# Patient Record
Sex: Female | Born: 2000 | Race: Black or African American | Hispanic: No | Marital: Single | Smoking: Never smoker
Health system: Southern US, Community
[De-identification: ages and names within clinical notes are randomized; demographics above are authoritative.]

## PROBLEM LIST (undated history)

## (undated) DIAGNOSIS — B009 Herpesviral infection, unspecified: Secondary | ICD-10-CM

## (undated) DIAGNOSIS — D649 Anemia, unspecified: Secondary | ICD-10-CM

---
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
## 2021-02-11 DIAGNOSIS — B009 Herpesviral infection, unspecified: Secondary | ICD-10-CM | POA: Insufficient documentation

## 2021-12-13 ENCOUNTER — Encounter (HOSPITAL_COMMUNITY): Payer: Self-pay | Admitting: Emergency Medicine

## 2021-12-13 ENCOUNTER — Ambulatory Visit (HOSPITAL_COMMUNITY)
Admission: EM | Admit: 2021-12-13 | Discharge: 2021-12-13 | Disposition: A | Discharge status: Home / Self Care | Payer: Medicaid Other | Attending: Emergency Medicine | Admitting: Emergency Medicine

## 2021-12-13 DIAGNOSIS — R21 Rash and other nonspecific skin eruption: Secondary | ICD-10-CM | POA: Diagnosis not present

## 2021-12-13 DIAGNOSIS — N898 Other specified noninflammatory disorders of vagina: Secondary | ICD-10-CM | POA: Insufficient documentation

## 2021-12-13 LAB — POC URINE PREG, ED: Preg Test, Ur: NEGATIVE

## 2021-12-13 MED ORDER — HYDROCORTISONE 1 % EX CREA: TOPICAL_CREAM | CUTANEOUS | 0 refills | Status: DC

## 2021-12-13 MED ORDER — MICONAZOLE NITRATE 2 % EX CREA: 1.0000 | TOPICAL_CREAM | Freq: Two times a day (BID) | CUTANEOUS | 0 refills | Status: DC

## 2021-12-13 MED ORDER — PREDNISONE 20 MG PO TABS: 40.0000 mg | ORAL_TABLET | Freq: Every day | ORAL | 0 refills | Status: DC

## 2021-12-13 MED ORDER — FLUCONAZOLE 150 MG PO TABS: 150.0000 mg | ORAL_TABLET | Freq: Every day | ORAL | 0 refills | Status: AC

## 2021-12-13 NOTE — ED Provider Notes (Signed)
[facility]    CSN: [id number] Arrival date & time: 12/13/21  1424      History   Chief Complaint Chief Complaint  Patient presents with   Vaginal Pain   Skin Problem    HPI Tiffany Cannon is a 21 y.o. female.   Patient presents with genital sore to the labia for 10 days.  Burning caused by urination otherwise site is not painful nor pruritic.  Denies drainage.  History of genital herpes but endorses not consistent with current symptoms.  Has attempted use of coconut oil which has provided some comfort.  Denies urinary symptoms, vaginal discharge, itching or odor.  Sexually active, 1 partner, no condom use, no known exposure.  Patient presents with a rash to the low left shoulder blade beginning 1 week ago.  Site is pruritic and creates a pulsating sensation when lying.  Was told by family that area appears to have a rash.  Is unable to visualize herself.  Has not attempted treatment.  Denies changes in soaps, lotions or detergents.  Does currently have in synthetic braids which she feels may have been rubbing against skin, has had similar spells in the past with no issue.     Past Medical History:  Diagnosis Date   Anemia    Herpes     There are no problems to display for this patient.   History reviewed. No pertinent surgical history.  OB History   No obstetric history on file.      Home Medications    Prior to Admission medications   Medication Sig Start Date End Date Taking? Authorizing Provider  fluconazole (DIFLUCAN) 150 MG tablet Take 1 tablet (150 mg total) by mouth daily. Repeat in 1 week if needed Patient not taking: Reported on 04/24/2020 05/03/19   Eustace Moore, MD  metroNIDAZOLE (FLAGYL) 500 MG tablet Take 1 tablet (500 mg total) by mouth 2 (two) times daily. 08/14/20   Brock Bad, MD  norethindrone-ethinyl estradiol 1/35 (ORTHO-NOVUM 1/35, 28,) tablet Take 1 tablet by mouth daily. 04/24/20   Brock Bad, MD    Family  History Family History  Problem Relation Age of Onset   Hypertension Mother     Social History Social History   Tobacco Use   Smoking status: Never   Smokeless tobacco: Never  Vaping Use   Vaping Use: Never used  Substance Use Topics   Alcohol use: Never   Drug use: Never     Allergies   Patient has no known allergies.   Review of Systems Review of Systems Defer to HPI    Physical Exam Triage Vital Signs ED Triage Vitals  Enc Vitals Group     BP 12/13/21 1550 118/74     Pulse Rate 12/13/21 1550 81     Resp 12/13/21 1550 18     Temp 12/13/21 1550 98.6 F (37 C)     Temp Source 12/13/21 1550 Oral     SpO2 12/13/21 1550 98 %     Weight --      Height --      Head Circumference --      Peak Flow --      Pain Score 12/13/21 1548 0     Pain Loc --      Pain Edu? --      Excl. in GC? --    No data found.  Updated Vital Signs BP 118/74 (BP Location: Right Arm)   Pulse 81   Temp  98.6 F (37 C) (Oral)   Resp 18   LMP 11/19/2021   SpO2 98%   Visual Acuity Right Eye Distance:   Left Eye Distance:   Bilateral Distance:    Right Eye Near:   Left Eye Near:    Bilateral Near:     Physical Exam Constitutional:      Appearance: Normal appearance.  Eyes:     Extraocular Movements: Extraocular movements intact.  Pulmonary:     Effort: Pulmonary effort is normal.  Genitourinary:    Comments: Copious Tiffany Cannon thick vaginal discharge present over the clitoris and bilateral labia, less than 0.5 cm superficial open wound present to the left labia, no drainage noted, chaperone present Skin:    Comments: Scattered less than 0.5 cm flesh tone and erythematous papules present to the left shoulder blade and center back, excoriations scattered throughout, no drainage noted, nontender, no swelling noted  Neurological:     Mental Status: She is alert and oriented to person, place, and time. Mental status is at baseline.  Psychiatric:        Mood and Affect: Mood normal.         Behavior: Behavior normal.      UC Treatments / Results  Labs (all labs ordered are listed, but only abnormal results are displayed) Labs Reviewed - No data to display  EKG   Radiology No results found.  Procedures Procedures (including critical care time)  Medications Ordered in UC Medications - No data to display  Initial Impression / Assessment and Plan / UC Course  I have reviewed the triage vital signs and the nursing notes.  Pertinent labs & imaging results that were available during my care of the patient were reviewed by me and considered in my medical decision making (see chart for details).  Vaginal discharge, rash  Presentation of discharge appears consistent with yeast, most likely as result of open skin to the labia which is caused by irritation, discussed with patient, Diflucan prescribed as well as miconazole external cream, discussed administration, STI labs are pending, advised abstinence until lab results, treatment is complete and symptoms have resolved, urine pregnancy is negative, patient may follow-up for reevaluation as needed  Rash is consistent with a contact dermatitis, no signs of infection, prescribed prednisone course as well as topical hydrocortisone cream to be used as needed after, advised to keep skin clean and dry and covered until symptoms have resolved, may follow-up for reevaluation as needed Final Clinical Impressions(s) / UC Diagnoses   Final diagnoses:  None   Discharge Instructions   None    ED Prescriptions   None    PDMP not reviewed this encounter.   Valinda Hoar, NP 12/13/21 1642

## 2021-12-13 NOTE — ED Triage Notes (Signed)
Pt reports red spot in vaginal area for about a week. Reports painful when using bathroom or wiping. Put coconut oil on area which helps.

## 2021-12-13 NOTE — ED Triage Notes (Signed)
Pt also wants back looked at for about week or so has itching and pulsating pain. Has bumps on back.

## 2021-12-13 NOTE — Discharge Instructions (Signed)
For your back -Skin is consistent with a contact dermatitis meaning your skin has become irritated and inflamed to something that is touched, most likely related to your hair clothing -Begin prednisone every morning with food for 5 days, this will reduce the inflammatory process that occurs with reactions can help to clear rash -After oral medicine is complete you may use hydrocortisone cream every morning and every evening as needed -Follow with urgent care if symptoms persist or worsen  For your genital lesion -Urine pregnancy test is negative -Area appears to be skin irritation most likely related to vaginal discharge which is consistent with yeast and presentation -Take 1 Diflucan tablet when you receive your medicine, then if symptoms are still present in 3 days take second dose -STI labs are pending, you will be notified for any positive test results and additional medication sent to pharmacy at time of notification -Please refrain from sex until all lab results have treated, symptoms are resolved and medication is complete -May follow-up for reevaluation as needed

## 2021-12-16 LAB — CERVICOVAGINAL ANCILLARY ONLY
Bacterial Vaginitis (gardnerella): NEGATIVE
Candida Glabrata: NEGATIVE
Candida Vaginitis: POSITIVE — AB
Chlamydia: NEGATIVE
Comment: NEGATIVE
Comment: NEGATIVE
Comment: NEGATIVE
Comment: NEGATIVE
Comment: NEGATIVE
Comment: NORMAL
Neisseria Gonorrhea: POSITIVE — AB
Trichomonas: NEGATIVE

## 2021-12-17 ENCOUNTER — Encounter (HOSPITAL_COMMUNITY): Payer: Self-pay | Admitting: Emergency Medicine

## 2021-12-17 ENCOUNTER — Telehealth (HOSPITAL_COMMUNITY): Payer: Self-pay | Admitting: Emergency Medicine

## 2021-12-17 ENCOUNTER — Ambulatory Visit (HOSPITAL_COMMUNITY)
Admission: EM | Admit: 2021-12-17 | Discharge: 2021-12-17 | Disposition: A | Discharge status: Home / Self Care | Payer: Medicaid Other | Attending: Internal Medicine | Admitting: Internal Medicine

## 2021-12-17 DIAGNOSIS — A549 Gonococcal infection, unspecified: Secondary | ICD-10-CM

## 2021-12-17 MED ORDER — LIDOCAINE HCL (PF) 1 % IJ SOLN
INTRAMUSCULAR | Status: AC
  Filled 2021-12-17: qty 2

## 2021-12-17 MED ORDER — CEFTRIAXONE SODIUM 500 MG IJ SOLR
500.0000 mg | INTRAMUSCULAR | Status: DC
  Administered 2021-12-17: 500 mg via INTRAMUSCULAR

## 2021-12-17 MED ORDER — CEFTRIAXONE SODIUM 500 MG IJ SOLR
INTRAMUSCULAR | Status: AC
  Filled 2021-12-17: qty 500

## 2021-12-17 MED ORDER — FLUCONAZOLE 150 MG PO TABS: 150.0000 mg | ORAL_TABLET | Freq: Once | ORAL | 0 refills | Status: AC

## 2021-12-17 NOTE — ED Triage Notes (Signed)
No answer in waiting area x 1 

## 2021-12-17 NOTE — Telephone Encounter (Signed)
Per protocol, patient will need treatment with IM Rocephin 500mg  for positive Gonorrhea.   Patient went home on Diflucan Contacted patient by phone.  Verified identity using two identifiers.  Provided positive result.  Reviewed safe sex practices, notifying partners, and refraining from sexual activities for 7 days from time of treatment.  Patient verified understanding, all questions answered.   [facility] notified Verified pharmacy, prescription sent

## 2021-12-17 NOTE — ED Triage Notes (Signed)
Pt reports that needing shot for gonorrhea.

## 2021-12-28 ENCOUNTER — Encounter (HOSPITAL_COMMUNITY): Payer: Self-pay | Admitting: Emergency Medicine

## 2021-12-28 ENCOUNTER — Ambulatory Visit (HOSPITAL_COMMUNITY)
Admission: EM | Admit: 2021-12-28 | Discharge: 2021-12-28 | Disposition: A | Discharge status: Home / Self Care | Payer: Medicaid Other | Attending: Physician Assistant | Admitting: Physician Assistant

## 2021-12-28 DIAGNOSIS — J020 Streptococcal pharyngitis: Secondary | ICD-10-CM | POA: Diagnosis not present

## 2021-12-28 LAB — POCT RAPID STREP A, ED / UC: Streptococcus, Group A Screen (Direct): POSITIVE — AB

## 2021-12-28 MED ORDER — IBUPROFEN 800 MG PO TABS
ORAL_TABLET | ORAL | Status: AC
  Filled 2021-12-28: qty 1

## 2021-12-28 MED ORDER — PENICILLIN G BENZATHINE 1200000 UNIT/2ML IM SUSY
PREFILLED_SYRINGE | INTRAMUSCULAR | Status: AC
  Filled 2021-12-28: qty 2

## 2021-12-28 MED ORDER — PENICILLIN G BENZATHINE 1200000 UNIT/2ML IM SUSY
1.2000 10*6.[IU] | PREFILLED_SYRINGE | Freq: Once | INTRAMUSCULAR | Status: AC
  Administered 2021-12-28: 1.2 10*6.[IU] via INTRAMUSCULAR

## 2021-12-28 MED ORDER — IBUPROFEN 800 MG PO TABS
800.0000 mg | ORAL_TABLET | Freq: Once | ORAL | Status: AC
  Administered 2021-12-28: 800 mg via ORAL

## 2021-12-28 NOTE — ED Provider Notes (Signed)
[facility]   MRN: [id number] DOB: Sep 08, 2000  Subjective:   Tiffany Cannon is a 21 y.o. female presenting for sore throat x 3 days.  It hurts to eat, speak, swallow foods.  She has been able to swallow liquids but states that this is painful as well.  She feels like she is coughing up some mucus.  She has had some bodyaches and fatigue.  No headache.  No fever.  No chest pain or shortness of breath.  Denies any recent sick contacts.  She did not take any medicine today so far.  Patient reports that she was seen at her primary care about 3 days ago and was tested for COVID and strep, both of which were negative at that time.   Current Facility-Administered Medications:    penicillin g benzathine (BICILLIN LA) 1200000 UNIT/2ML injection 1.2 Million Units, 1.2 Million Units, Intramuscular, Once, Jaquetta Currier M, PA-C  Current Outpatient Medications:    hydrocortisone cream 1 %, Apply to affected area 2 times daily, Disp: 15 g, Rfl: 0   metroNIDAZOLE (FLAGYL) 500 MG tablet, Take 1 tablet (500 mg total) by mouth 2 (two) times daily., Disp: 14 tablet, Rfl: 0   miconazole (MICOTIN) 2 % cream, Apply 1 Application topically 2 (two) times daily., Disp: 28.35 g, Rfl: 0   norethindrone-ethinyl estradiol 1/35 (ORTHO-NOVUM 1/35, 28,) tablet, Take 1 tablet by mouth daily., Disp: 28 tablet, Rfl: 11   predniSONE (DELTASONE) 20 MG tablet, Take 2 tablets (40 mg total) by mouth daily., Disp: 10 tablet, Rfl: 0   No Known Allergies  Past Medical History:  Diagnosis Date   Anemia    Herpes      History reviewed. No pertinent surgical history.  Family History  Problem Relation Age of Onset   Hypertension Mother     Social History   Tobacco Use   Smoking status: Never   Smokeless tobacco: Never  Vaping Use   Vaping Use: Never used  Substance Use Topics   Alcohol use: Never   Drug use: Never    ROS REFER TO HPI FOR PERTINENT POSITIVES AND NEGATIVES   Objective:    Vitals: Pulse 83   Temp 99 F (37.2 C) (Oral)   Resp 17   LMP 11/14/2021   SpO2 97%   Physical Exam Vitals and nursing note reviewed.  Constitutional:      General: She is not in acute distress.    Appearance: Normal appearance. She is not ill-appearing.     Comments: Patient has an emesis bag and is actively spitting up mucus intermittently.  HENT:     Head: Normocephalic.     Right Ear: Tympanic membrane, ear canal and external ear normal.     Left Ear: Tympanic membrane, ear canal and external ear normal.     Nose: No congestion.     Mouth/Throat:     Mouth: Mucous membranes are moist.     Pharynx: Uvula midline. Oropharyngeal exudate and posterior oropharyngeal erythema present. No uvula swelling.     Tonsils: Tonsillar exudate present. No tonsillar abscesses. 3+ on the right. 3+ on the left.  Eyes:     Extraocular Movements: Extraocular movements intact.     Conjunctiva/sclera: Conjunctivae normal.     Pupils: Pupils are equal, round, and reactive to light.  Cardiovascular:     Rate and Rhythm: Normal rate and regular rhythm.     Pulses: Normal pulses.     Heart sounds: Normal heart sounds. No  murmur heard. Pulmonary:     Effort: Pulmonary effort is normal. No respiratory distress.     Breath sounds: Normal breath sounds. No wheezing.  Abdominal:     General: Abdomen is flat.     Palpations: Abdomen is soft. There is no mass.     Tenderness: There is no guarding.  Musculoskeletal:     Cervical back: Normal range of motion.  Skin:    General: Skin is warm.  Neurological:     Mental Status: She is alert and oriented to person, place, and time.  Psychiatric:        Mood and Affect: Mood normal.        Behavior: Behavior normal.     Results for orders placed or performed during the hospital encounter of 12/28/21 (from the past 24 hour(s))  POCT Rapid Strep A     Status: Abnormal   Collection Time: 12/28/21  1:34 PM  Result Value Ref Range   Streptococcus,  Group A Screen (Direct) POSITIVE (A) NEGATIVE    Assessment and Plan :   PDMP not reviewed this encounter.  1. Streptococcal pharyngitis   Rapid strep a test positive.  Confirmed that patient has no drug allergies.  Because of difficulty and pain with swallowing, offered Bicillin injection and patient agreed to this.  She will alternate Tylenol and ibuprofen.  She will push fluids.  Strict ER precautions discussed.  Work note provided.  Patient agreeable and understanding of plan.     Laketha Leopard, Crist Infante, PA-C 12/28/21 1400

## 2021-12-28 NOTE — ED Triage Notes (Signed)
Pt wrote note stating "I can't speak. I came here today due to me not being able to eat, and barely drink. Mucous is clogged and it hurts when I cough it up. This been going on for 3 days".

## 2021-12-28 NOTE — Discharge Instructions (Signed)
Your test for strep throat was positive.  You were given a shot of penicillin, which should help to resolve your infection in the next few days.  Please continue to push fluids and alternate Tylenol and ibuprofen.  Present to the emergency department if acutely worse symptoms.

## 2022-04-28 ENCOUNTER — Ambulatory Visit
Admission: RE | Admit: 2022-04-28 | Discharge: 2022-04-28 | Disposition: A | Discharge status: Home / Self Care | Payer: Self-pay | Source: Ambulatory Visit | Attending: Internal Medicine | Admitting: Internal Medicine

## 2022-04-28 VITALS — BP 151/84 | HR 84 | Temp 98.4°F | Resp 16

## 2022-04-28 DIAGNOSIS — N898 Other specified noninflammatory disorders of vagina: Secondary | ICD-10-CM | POA: Insufficient documentation

## 2022-04-28 DIAGNOSIS — Z202 Contact with and (suspected) exposure to infections with a predominantly sexual mode of transmission: Secondary | ICD-10-CM | POA: Insufficient documentation

## 2022-04-28 MED ORDER — METRONIDAZOLE 500 MG PO TABS: 500.0000 mg | ORAL_TABLET | Freq: Two times a day (BID) | ORAL | 0 refills | Status: DC

## 2022-04-28 NOTE — Discharge Instructions (Addendum)
You were tested today for STDs and the results are still pending; you have been given treatment for possible infections presumptively anyway due to your symptoms. You will receive a phone call in approximately 3 days if the results are positive. You should follow up with your primary care provider for further STI testing.  Avoid sexual intercourse for 7 days. Advise your sexual partner(s) to be evaluated, tested and treated. This includes all sexual partners within the past 60 days or your last sexual partner if last contact was greater than 60 days.  To minimize the risk of reinfection, you should abstain from sexual intercourse until your sexual partners have been tested and treated.   Consistent condom use is important in preventing the spread of sexually transmitted infections.  We will treat for any other positive results when your labs come back. If you do not hear from us, this means that your STI testing was negative or there is no change to your treatment plan. You will also receive these results via MyChart.   Return if you experience fevers 100.4 or greater, worsening or uncontrolled pain, rashes, sores, vomiting, or for any other concerning symptoms.   

## 2022-04-28 NOTE — ED Provider Notes (Signed)
[name]    CSN: [id number] Arrival date & time: 04/28/22  1723      History   Chief Complaint Chief Complaint  Patient presents with   Exposure to STD    I would like to get tested for everything. And I also have an issue w my body, that's causing me to jerk whenever I am cold, " anxiety ". Help please. - Entered by patient    HPI Tiffany Cannon is a 22 y.o. female.   Patient presents to urgent care for evaluation of vaginal odor with thick white vaginal discharge starting a couple of days ago. Reports recent new female unprotected sexual partner and concern for STD, would like to be screened. Denies known exposure to STD, vaginal itching, rash, nausea, vomiting, diarrhea, abdominal pain, and urinary symptoms. LMP 3/13-3/18. No OTC medicines attempted to help with symptoms.   Exposure to STD    Past Medical History:  Diagnosis Date   Anemia    Herpes     There are no problems to display for this patient.   History reviewed. No pertinent surgical history.  OB History   No obstetric history on file.      Home Medications    Prior to Admission medications   Medication Sig Start Date End Date Taking? Authorizing Provider  metroNIDAZOLE (FLAGYL) 500 MG tablet Take 1 tablet (500 mg total) by mouth 2 (two) times daily. 04/28/22  Yes Carlisle BeersStanhope, [place] Gandolfo M, FNP  hydrocortisone cream 1 % Apply to affected area 2 times daily 12/13/21   Valinda HoarWhite, Adrienne R, NP  miconazole (MICOTIN) 2 % cream Apply 1 Application topically 2 (two) times daily. 12/13/21   Valinda HoarWhite, Adrienne R, NP  norethindrone-ethinyl estradiol 1/35 (ORTHO-NOVUM 1/35, 28,) tablet Take 1 tablet by mouth daily. 04/24/20   Brock BadHarper, Charles A, MD  predniSONE (DELTASONE) 20 MG tablet Take 2 tablets (40 mg total) by mouth daily. 12/13/21   Valinda HoarWhite, Adrienne R, NP    Family History Family History  Problem Relation Age of Onset   Hypertension Mother     Social History Social History   Tobacco Use   Smoking  status: Never   Smokeless tobacco: Never  Vaping Use   Vaping Use: Never used  Substance Use Topics   Alcohol use: Never   Drug use: Never     Allergies   Patient has no known allergies.   Review of Systems Review of Systems Per HPI  Physical Exam Triage Vital Signs ED Triage Vitals [04/28/22 1935]  Enc Vitals Group     BP (!) 151/84     Pulse Rate 84     Resp 16     Temp 98.4 F (36.9 C)     Temp Source Oral     SpO2 98 %     Weight      Height      Head Circumference      Peak Flow      Pain Score 0     Pain Loc      Pain Edu?      Excl. in GC?    No data found.  Updated Vital Signs BP (!) 151/84 (BP Location: Left Arm)   Pulse 84   Temp 98.4 F (36.9 C) (Oral)   Resp 16   SpO2 98%   Visual Acuity Right Eye Distance:   Left Eye Distance:   Bilateral Distance:    Right Eye Near:   Left Eye Near:    Bilateral  Near:     Physical Exam Vitals and nursing note reviewed.  Constitutional:      Appearance: She is not ill-appearing or toxic-appearing.  HENT:     Head: Normocephalic and atraumatic.     Right Ear: Hearing and external ear normal.     Left Ear: Hearing and external ear normal.     Nose: Nose normal.     Mouth/Throat:     Lips: Pink.  Eyes:     General: Lids are normal. Vision grossly intact. Gaze aligned appropriately.     Extraocular Movements: Extraocular movements intact.     Conjunctiva/sclera: Conjunctivae normal.  Pulmonary:     Effort: Pulmonary effort is normal.  Genitourinary:    Comments: Deferred.  Musculoskeletal:     Cervical back: Neck supple.  Skin:    General: Skin is warm and dry.     Capillary Refill: Capillary refill takes less than 2 seconds.     Findings: No rash.  Neurological:     General: No focal deficit present.     Mental Status: She is alert and oriented to person, place, and time. Mental status is at baseline.     Cranial Nerves: No dysarthria or facial asymmetry.  Psychiatric:        Mood and  Affect: Mood normal.        Speech: Speech normal.        Behavior: Behavior normal.        Thought Content: Thought content normal.        Judgment: Judgment normal.      UC Treatments / Results  Labs (all labs ordered are listed, but only abnormal results are displayed) Labs Reviewed  HIV ANTIBODY (ROUTINE TESTING W REFLEX)  RPR  CERVICOVAGINAL ANCILLARY ONLY    EKG   Radiology No results found.  Procedures Procedures (including critical care time)  Medications Ordered in UC Medications - No data to display  Initial Impression / Assessment and Plan / UC Course  I have reviewed the triage vital signs and the nursing notes.  Pertinent labs & imaging results that were available during my care of the patient were reviewed by me and considered in my medical decision making (see chart for details).   1. Possible exposure to STD STI labs pending, patient would like HIV and/or syphilis testing today. Will go ahead and start treatment for bacterial vaginosis based on symptoms and history of same. Flagyl sent to pharmacy to be taken as prescribed. Discussed potential interaction with alcohol use during use of medication. Will treat for all other positive results once STI labs result. Patient to abstain from sexual intercourse for 7 days while undergoing treatment. Education provided regarding safe sexual practices and patient encouraged to use protection to prevent spread of STIs.   Discussed physical exam and available lab work findings in clinic with patient.  Counseled patient regarding appropriate use of medications and potential side effects for all medications recommended or prescribed today. Discussed red flag signs and symptoms of worsening condition,when to call the PCP office, return to urgent care, and when to seek higher level of care in the emergency department. Patient verbalizes understanding and agreement with plan. All questions answered. Patient discharged in stable  condition.    Final Clinical Impressions(s) / UC Diagnoses   Final diagnoses:  Possible exposure to STD     Discharge Instructions      You were tested today for STDs and the results are still pending; you have been  given treatment for possible infections presumptively anyway due to your symptoms. You will receive a phone call in approximately 3 days if the results are positive. You should follow up with your primary care provider for further STI testing.   Avoid sexual intercourse for 7 days. Advise your sexual partner(s) to be evaluated, tested and treated. This includes all sexual partners within the past 60 days or your last sexual partner if last contact was greater than 60 days.  To minimize the risk of reinfection, you should abstain from sexual intercourse until your sexual partners have been tested and treated.   Consistent condom use is important in preventing the spread of sexually transmitted infections.  We will treat for any other positive results when your labs come back. If you do not hear from [place], this means that your STI testing was negative or there is no change to your treatment plan. You will also receive these results via MyChart.   Return if you experience fevers 100.4 or greater, worsening or uncontrolled pain, rashes, sores, vomiting, or for any other concerning symptoms.      ED Prescriptions     Medication Sig Dispense Auth. Provider   metroNIDAZOLE (FLAGYL) 500 MG tablet Take 1 tablet (500 mg total) by mouth 2 (two) times daily. 14 tablet Carlisle Beers, FNP      PDMP not reviewed this encounter.   Carlisle Beers, [place] 05/01/22 2057

## 2022-04-28 NOTE — ED Triage Notes (Signed)
Pt c/o intermittent vaginal odor requesting sti screening. Onset ~ over a week ago   Also states when she is around others her body shakes and concerned for anxiety onset ~ Jan.

## 2022-04-30 ENCOUNTER — Telehealth: Payer: Self-pay | Admitting: Emergency Medicine

## 2022-04-30 LAB — HIV ANTIBODY (ROUTINE TESTING W REFLEX): HIV Screen 4th Generation wRfx: NONREACTIVE

## 2022-04-30 LAB — RPR: RPR Ser Ql: NONREACTIVE

## 2022-04-30 LAB — CERVICOVAGINAL ANCILLARY ONLY
Bacterial Vaginitis (gardnerella): POSITIVE — AB
Candida Glabrata: NEGATIVE
Candida Vaginitis: NEGATIVE
Chlamydia: POSITIVE — AB
Comment: NEGATIVE
Comment: NEGATIVE
Comment: NEGATIVE
Comment: NEGATIVE
Comment: NEGATIVE
Comment: NORMAL
Neisseria Gonorrhea: POSITIVE — AB
Trichomonas: NEGATIVE

## 2022-04-30 MED ORDER — DOXYCYCLINE HYCLATE 100 MG PO CAPS: 100.0000 mg | ORAL_CAPSULE | Freq: Two times a day (BID) | ORAL | 0 refills | Status: DC

## 2022-05-01 ENCOUNTER — Ambulatory Visit (HOSPITAL_COMMUNITY): Payer: Self-pay

## 2022-05-01 ENCOUNTER — Ambulatory Visit
Admission: RE | Admit: 2022-05-01 | Discharge: 2022-05-01 | Disposition: A | Discharge status: Home / Self Care | Payer: Self-pay | Source: Ambulatory Visit | Attending: Nurse Practitioner | Admitting: Nurse Practitioner

## 2022-05-01 DIAGNOSIS — B9689 Other specified bacterial agents as the cause of diseases classified elsewhere: Secondary | ICD-10-CM

## 2022-05-01 DIAGNOSIS — A749 Chlamydial infection, unspecified: Secondary | ICD-10-CM

## 2022-05-01 DIAGNOSIS — N760 Acute vaginitis: Secondary | ICD-10-CM

## 2022-05-01 DIAGNOSIS — A549 Gonococcal infection, unspecified: Secondary | ICD-10-CM

## 2022-05-01 MED ORDER — CEFTRIAXONE SODIUM 500 MG IJ SOLR
500.0000 mg | Freq: Once | INTRAMUSCULAR | Status: AC
  Administered 2022-05-01: 500 mg via INTRAMUSCULAR

## 2022-05-01 MED ORDER — METRONIDAZOLE 500 MG PO TABS: 500.0000 mg | ORAL_TABLET | Freq: Two times a day (BID) | ORAL | 0 refills | Status: DC

## 2022-05-01 MED ORDER — DOXYCYCLINE HYCLATE 100 MG PO CAPS: 100.0000 mg | ORAL_CAPSULE | Freq: Two times a day (BID) | ORAL | 0 refills | Status: AC

## 2022-05-01 NOTE — Discharge Instructions (Addendum)
You have been treated for gonorrhea, chlamydia and bacterial vaginosis.  Make sure you finish all the medications as prescribed. You should not have sex until the medications are completed and your partner(s) have also been treated.  It is important that you practice safe sex practices by wearing a condom every time you have sex. Remember that people who have STIs may not experience any symptoms. However, even without symptoms, these infections can be spread from person to person and require treatment. STIs can be treated, and many STIs can be cured. However, some STIs cannot be cured and will affect you for the rest of your life. It's important to be checked regularly for STIs. You should also consider taking pre-exposure prophylaxis (PrEP) to prevent HIV infection.

## 2022-05-01 NOTE — ED Provider Notes (Signed)
Patient presents for STD treatment. She was seen here on 04/28/22. She has tested positive for gonorrhea, chlamydia and bacterial vaginosis.  Flagyl and Doxycycline sent into patient's pharmacy prior to her arrival; however, patient now is requesting that these medications be sent to a different pharmacy.  Prescription sent to the patient's pharmacy of choice.  Rocephin 500 mg IM given in clinic.  Safe sex education provided.   Lurline Idol, [place] 05/01/22 1640

## 2022-05-01 NOTE — ED Triage Notes (Signed)
Pt presents for std treatment. Denies concerns.

## 2022-05-02 ENCOUNTER — Ambulatory Visit: Payer: Self-pay | Admitting: Internal Medicine

## 2022-06-09 ENCOUNTER — Ambulatory Visit (HOSPITAL_COMMUNITY): Payer: Self-pay

## 2022-06-21 ENCOUNTER — Encounter (HOSPITAL_COMMUNITY): Payer: Self-pay

## 2022-06-21 ENCOUNTER — Ambulatory Visit (HOSPITAL_COMMUNITY)
Admission: RE | Admit: 2022-06-21 | Discharge: 2022-06-21 | Disposition: A | Discharge status: Home / Self Care | Payer: Medicaid Other | Source: Ambulatory Visit | Attending: Physician Assistant | Admitting: Physician Assistant

## 2022-06-21 VITALS — BP 115/79 | HR 76 | Temp 98.5°F | Resp 18

## 2022-06-21 DIAGNOSIS — Z113 Encounter for screening for infections with a predominantly sexual mode of transmission: Secondary | ICD-10-CM | POA: Insufficient documentation

## 2022-06-21 DIAGNOSIS — N898 Other specified noninflammatory disorders of vagina: Secondary | ICD-10-CM | POA: Insufficient documentation

## 2022-06-21 DIAGNOSIS — Z3202 Encounter for pregnancy test, result negative: Secondary | ICD-10-CM

## 2022-06-21 DIAGNOSIS — N760 Acute vaginitis: Secondary | ICD-10-CM

## 2022-06-21 LAB — HIV ANTIBODY (ROUTINE TESTING W REFLEX): HIV Screen 4th Generation wRfx: NONREACTIVE

## 2022-06-21 LAB — HEPATITIS C ANTIBODY: HCV Ab: NONREACTIVE

## 2022-06-21 LAB — POCT URINE PREGNANCY: Preg Test, Ur: NEGATIVE

## 2022-06-21 MED ORDER — FLUCONAZOLE 150 MG PO TABS: 150.0000 mg | ORAL_TABLET | ORAL | 0 refills | Status: DC

## 2022-06-21 MED ORDER — FLUCONAZOLE 150 MG PO TABS: 150.0000 mg | ORAL_TABLET | ORAL | 0 refills | Status: AC

## 2022-06-21 NOTE — Discharge Instructions (Signed)
Your urine pregnancy test was negative.  We are treating you for a yeast infection.  Please take 1 dose of Diflucan today and a second dose in 3 days.  Use hypoallergenic soaps and detergents and wear loosefitting cotton underwear.  We will contact you if any of your testing obtained today is positive.  Monitor your MyChart for these results.  Use a condom with each sexual encounter.  Make sure all partners are tested and treated as well as you can get recurrent STIs.  If you develop any worsening or changing symptoms including pelvic pain, abdominal pain, fever, nausea, vomiting you should be seen immediately.

## 2022-06-21 NOTE — ED Triage Notes (Signed)
Pt is here for std testing. Pt reports vaginal itching and burning. Pt is taking Flagyl.

## 2022-06-21 NOTE — ED Provider Notes (Signed)
[facility]    CSN: [id number] Arrival date & time: 06/21/22  1111      History   Chief Complaint Chief Complaint  Patient presents with   SEXUALLY TRANSMITTED DISEASE    Entered by patient    HPI Tiffany Cannon is a 22 y.o. female.   Patient presents today with a several week history of thick white discharge and associated vaginal irritation/pruritus.  She was seen by our clinic on 04/28/2022 at which point she tested positive for gonorrhea, chlamydia, bacterial vaginosis.  She did receive treatment and has completed course of antibiotics.  She denies any changes to personal hygiene products.  She has been using tea tree oil and personal wipes which provides temporary relief of symptoms.  She does not believe that she is pregnant but is open to testing she cannot remember her last menstrual cycle.  She denies history of diabetes or immunosuppression.  Does not take SGLT2 inhibitor.  She has not tried any over-the-counter medication for symptom management.    Past Medical History:  Diagnosis Date   Anemia    Herpes     There are no problems to display for this patient.   History reviewed. No pertinent surgical history.  OB History   No obstetric history on file.      Home Medications    Prior to Admission medications   Medication Sig Start Date End Date Taking? Authorizing Provider  fluconazole (DIFLUCAN) 150 MG tablet Take 1 tablet (150 mg total) by mouth every 3 (three) days for 2 doses. 06/21/22 06/25/22 Yes Clary Meeker, Noberto Retort, PA-C  hydrocortisone cream 1 % Apply to affected area 2 times daily 12/13/21   Valinda Hoar, NP  norethindrone-ethinyl estradiol 1/35 (ORTHO-NOVUM 1/35, 28,) tablet Take 1 tablet by mouth daily. 04/24/20   Brock Bad, MD    Family History Family History  Problem Relation Age of Onset   Hypertension Mother     Social History Social History   Tobacco Use   Smoking status: Never   Smokeless tobacco: Never  Vaping Use    Vaping Use: Never used  Substance Use Topics   Alcohol use: Never   Drug use: Never     Allergies   Patient has no known allergies.   Review of Systems Review of Systems  Constitutional:  Positive for activity change. Negative for appetite change, fatigue and fever.  Gastrointestinal:  Negative for abdominal pain, diarrhea, nausea and vomiting.  Genitourinary:  Positive for vaginal discharge and vaginal pain (irritation). Negative for dysuria, frequency, pelvic pain, urgency and vaginal bleeding.     Physical Exam Triage Vital Signs ED Triage Vitals [06/21/22 1123]  Enc Vitals Group     BP 115/79     Pulse Rate 76     Resp 18     Temp 98.5 F (36.9 C)     Temp Source Oral     SpO2 98 %     Weight      Height      Head Circumference      Peak Flow      Pain Score      Pain Loc      Pain Edu?      Excl. in GC?    No data found.  Updated Vital Signs BP 115/79 (BP Location: Left Arm)   Pulse 76   Temp 98.5 F (36.9 C) (Oral)   Resp 18   SpO2 98%   Visual Acuity Right Eye Distance:  Left Eye Distance:   Bilateral Distance:    Right Eye Near:   Left Eye Near:    Bilateral Near:     Physical Exam Vitals reviewed.  Constitutional:      General: She is awake. She is not in acute distress.    Appearance: Normal appearance. She is well-developed. She is not ill-appearing.     Comments: Very pleasant female appears stated age in no acute distress sitting comfortably in exam room  HENT:     Head: Normocephalic and atraumatic.  Cardiovascular:     Rate and Rhythm: Normal rate and regular rhythm.     Heart sounds: Normal heart sounds, S1 normal and S2 normal. No murmur heard. Pulmonary:     Effort: Pulmonary effort is normal.     Breath sounds: Normal breath sounds. No wheezing, rhonchi or rales.     Comments: Clear to auscultation bilaterally Abdominal:     General: Bowel sounds are normal.     Palpations: Abdomen is soft.     Tenderness: There is no  abdominal tenderness. There is no right CVA tenderness, left CVA tenderness, guarding or rebound.  Psychiatric:        Behavior: Behavior is cooperative.      UC Treatments / Results  Labs (all labs ordered are listed, but only abnormal results are displayed) Labs Reviewed  RPR  HIV ANTIBODY (ROUTINE TESTING W REFLEX)  HEPATITIS C ANTIBODY  POCT URINE PREGNANCY  CERVICOVAGINAL ANCILLARY ONLY    EKG   Radiology No results found.  Procedures Procedures (including critical care time)  Medications Ordered in UC Medications - No data to display  Initial Impression / Assessment and Plan / UC Course  I have reviewed the triage vital signs and the nursing notes.  Pertinent labs & imaging results that were available during my care of the patient were reviewed by me and considered in my medical decision making (see chart for details).     Patient is well-appearing, afebrile, nontoxic, nontachycardic.  Vital signs physical exam are reassuring.  Urine pregnancy test was obtained and was negative.  Suspect yeast vaginitis as etiology of symptoms given recent antibiotic use and description of symptoms.  She was empirically treated with Diflucan 1 dose today and a second dose in 3 days if symptoms persist.  STI swab was collected and is pending.  Additional STI testing including HIV, hepatitis, syphilis was obtained and is pending.  We discussed the importance of safe sex practices.  Also discussed that all partners need to be tested and treated as well as it is possible to get STIs again.  If she has any worsening or changing symptoms including persistent discharge, abdominal pain, pelvic pain, fever, nausea, vomiting she needs to be seen immediately.  Strict return precautions given.  Final Clinical Impressions(s) / UC Diagnoses   Final diagnoses:  Acute vaginitis  Vaginal discharge  Screening examination for STI  Encounter for pregnancy test with result negative     Discharge  Instructions      Your urine pregnancy test was negative.  We are treating you for a yeast infection.  Please take 1 dose of Diflucan today and a second dose in 3 days.  Use hypoallergenic soaps and detergents and wear loosefitting cotton underwear.  We will contact you if any of your testing obtained today is positive.  Monitor your MyChart for these results.  Use a condom with each sexual encounter.  Make sure all partners are tested and treated as  well as you can get recurrent STIs.  If you develop any worsening or changing symptoms including pelvic pain, abdominal pain, fever, nausea, vomiting you should be seen immediately.     ED Prescriptions     Medication Sig Dispense Auth. Provider   fluconazole (DIFLUCAN) 150 MG tablet Take 1 tablet (150 mg total) by mouth every 3 (three) days for 2 doses. 2 tablet Kallum Jorgensen, Noberto Retort, PA-C      PDMP not reviewed this encounter.   Jeani Hawking, PA-C 06/21/22 1204

## 2022-06-22 LAB — RPR: RPR Ser Ql: NONREACTIVE

## 2022-06-23 LAB — CERVICOVAGINAL ANCILLARY ONLY
Bacterial Vaginitis (gardnerella): NEGATIVE
Candida Glabrata: NEGATIVE
Candida Vaginitis: POSITIVE — AB
Chlamydia: NEGATIVE
Comment: NEGATIVE
Comment: NEGATIVE
Comment: NEGATIVE
Comment: NEGATIVE
Comment: NEGATIVE
Comment: NORMAL
Neisseria Gonorrhea: NEGATIVE
Trichomonas: NEGATIVE

## 2022-08-08 ENCOUNTER — Ambulatory Visit (HOSPITAL_COMMUNITY): Payer: Medicaid Other

## 2022-08-28 ENCOUNTER — Encounter (HOSPITAL_COMMUNITY): Payer: Self-pay

## 2022-08-28 ENCOUNTER — Ambulatory Visit (HOSPITAL_COMMUNITY)
Admission: EM | Admit: 2022-08-28 | Discharge: 2022-08-28 | Disposition: A | Discharge status: Home / Self Care | Payer: Medicaid Other | Attending: Family Medicine | Admitting: Family Medicine

## 2022-08-28 DIAGNOSIS — R102 Pelvic and perineal pain: Secondary | ICD-10-CM | POA: Insufficient documentation

## 2022-08-28 LAB — POCT URINALYSIS DIP (MANUAL ENTRY)
Bilirubin, UA: NEGATIVE
Blood, UA: NEGATIVE
Glucose, UA: NEGATIVE mg/dL
Ketones, POC UA: NEGATIVE mg/dL
Leukocytes, UA: NEGATIVE
Nitrite, UA: NEGATIVE
Protein Ur, POC: NEGATIVE mg/dL
Spec Grav, UA: 1.025 (ref 1.010–1.025)
Urobilinogen, UA: 0.2 E.U./dL
pH, UA: 6.5 (ref 5.0–8.0)

## 2022-08-28 LAB — HIV ANTIBODY (ROUTINE TESTING W REFLEX): HIV Screen 4th Generation wRfx: NONREACTIVE

## 2022-08-28 LAB — POCT URINE PREGNANCY: Preg Test, Ur: NEGATIVE

## 2022-08-28 NOTE — Discharge Instructions (Signed)
The pregnancy test was negative  The urinalysis was clear and did not show any sign of urinary infection.  Staff will notify you if there is anything positive on the swab or the blood work (which was drawn to check HIV and syphilis tests).

## 2022-08-28 NOTE — ED Triage Notes (Signed)
Abdominal pain onset a few week. Will have chest tightness in to low abdominal pain/pressure. No known std exposure and wanting to be tested.   No urinary symptoms, discharge, or odor.

## 2022-08-28 NOTE — ED Provider Notes (Signed)
[facility]    CSN: [id number] Arrival date & time: 08/28/22  1017      History   Chief Complaint Chief Complaint  Patient presents with   Abdominal Pain   SEXUALLY TRANSMITTED DISEASE    HPI Tiffany Cannon is a 22 y.o. female.    Abdominal Pain Here for some pelvic pain that has been going on for a few weeks.  Constant and is more of a pressure.  No dysuria or hematuria.  No fever or chills or nausea or vomiting.  No constipation and bowel movements have been her usual normal.  LMP was July 21.  She is not allergic to any medication    Past Medical History:  Diagnosis Date   Anemia    Herpes     There are no problems to display for this patient.   History reviewed. No pertinent surgical history.  OB History   No obstetric history on file.      Home Medications    Prior to Admission medications   Not on File    Family History Family History  Problem Relation Age of Onset   Hypertension Mother     Social History Social History   Tobacco Use   Smoking status: Never   Smokeless tobacco: Never  Vaping Use   Vaping status: Never Used  Substance Use Topics   Alcohol use: Never   Drug use: Never     Allergies   Patient has no known allergies.   Review of Systems Review of Systems  Gastrointestinal:  Positive for abdominal pain.     Physical Exam Triage Vital Signs ED Triage Vitals  Encounter Vitals Group     BP 08/28/22 1034 110/79     Systolic BP Percentile --      Diastolic BP Percentile --      Pulse Rate 08/28/22 1034 69     Resp 08/28/22 1034 16     Temp 08/28/22 1034 (!) 97.3 F (36.3 C)     Temp Source 08/28/22 1034 Oral     SpO2 08/28/22 1034 97 %     Weight 08/28/22 1034 240 lb (108.9 kg)     Height 08/28/22 1034 5\' 9"  (1.753 m)     Head Circumference --      Peak Flow --      Pain Score 08/28/22 1033 8     Pain Loc --      Pain Education --      Exclude from Growth Chart --    No data  found.  Updated Vital Signs BP 110/79 (BP Location: Right Arm)   Pulse 69   Temp (!) 97.3 F (36.3 C) (Oral)   Resp 16   Ht 5\' 9"  (1.753 m)   Wt 108.9 kg   LMP 08/10/2022 (Approximate)   SpO2 97%   BMI 35.44 kg/m   Visual Acuity Right Eye Distance:   Left Eye Distance:   Bilateral Distance:    Right Eye Near:   Left Eye Near:    Bilateral Near:     Physical Exam Vitals reviewed.  Constitutional:      General: She is not in acute distress.    Appearance: She is not ill-appearing, toxic-appearing or diaphoretic.  HENT:     Mouth/Throat:     Mouth: Mucous membranes are moist.  Cardiovascular:     Rate and Rhythm: Normal rate and regular rhythm.     Heart sounds: No murmur heard. Pulmonary:  Effort: Pulmonary effort is normal.     Breath sounds: Normal breath sounds.  Abdominal:     General: Bowel sounds are normal. There is no distension.     Palpations: Abdomen is soft. There is no mass.     Tenderness: There is no abdominal tenderness. There is no guarding.  Skin:    Coloration: Skin is not jaundiced or pale.  Neurological:     General: No focal deficit present.     Mental Status: She is alert and oriented to person, place, and time.  Psychiatric:        Behavior: Behavior normal.      UC Treatments / Results  Labs (all labs ordered are listed, but only abnormal results are displayed) Labs Reviewed  POCT URINALYSIS DIP (MANUAL ENTRY) - Abnormal; Notable for the following components:      Result Value   Clarity, UA hazy (*)    All other components within normal limits  RPR  HIV ANTIBODY (ROUTINE TESTING W REFLEX)  POCT URINE PREGNANCY  CERVICOVAGINAL ANCILLARY ONLY    EKG   Radiology No results found.  Procedures Procedures (including critical care time)  Medications Ordered in UC Medications - No data to display  Initial Impression / Assessment and Plan / UC Course  I have reviewed the triage vital signs and the nursing  notes.  Pertinent labs & imaging results that were available during my care of the patient were reviewed by me and considered in my medical decision making (see chart for details).       UA is negative. UPT is negative.  Lab is drawn also per her request to check HIV and RPR. Vaginal self swab is done, and we will notify of any positives on that and treat per protocol.  Final Clinical Impressions(s) / UC Diagnoses   Final diagnoses:  Pelvic pain in female     Discharge Instructions      The pregnancy test was negative  The urinalysis was clear and did not show any sign of urinary infection.  Staff will notify you if there is anything positive on the swab or the blood work (which was drawn to check HIV and syphilis tests).      ED Prescriptions   None    PDMP not reviewed this encounter.   Tiffany Resides, MD 08/28/22 [phone]

## 2022-09-01 ENCOUNTER — Telehealth (HOSPITAL_COMMUNITY): Payer: Self-pay

## 2022-09-01 MED ORDER — METRONIDAZOLE 500 MG PO TABS: 500.0000 mg | ORAL_TABLET | Freq: Two times a day (BID) | ORAL | 0 refills | Status: DC

## 2022-09-27 ENCOUNTER — Ambulatory Visit (HOSPITAL_COMMUNITY): Payer: Medicaid Other

## 2022-10-01 ENCOUNTER — Ambulatory Visit (HOSPITAL_COMMUNITY): Payer: Self-pay

## 2022-11-10 ENCOUNTER — Ambulatory Visit (HOSPITAL_COMMUNITY): Payer: Medicaid Other

## 2022-11-11 ENCOUNTER — Ambulatory Visit (HOSPITAL_COMMUNITY): Payer: Self-pay

## 2022-11-19 ENCOUNTER — Encounter: Payer: Medicaid Other | Admitting: Family Medicine

## 2022-11-21 ENCOUNTER — Ambulatory Visit (HOSPITAL_COMMUNITY)
Admission: EM | Admit: 2022-11-21 | Discharge: 2022-11-21 | Disposition: A | Discharge status: Home / Self Care | Payer: Self-pay | Attending: Emergency Medicine | Admitting: Emergency Medicine

## 2022-11-21 ENCOUNTER — Encounter (HOSPITAL_COMMUNITY): Payer: Self-pay | Admitting: Emergency Medicine

## 2022-11-21 DIAGNOSIS — Z113 Encounter for screening for infections with a predominantly sexual mode of transmission: Secondary | ICD-10-CM | POA: Insufficient documentation

## 2022-11-21 LAB — POCT URINALYSIS DIP (MANUAL ENTRY)
Bilirubin, UA: NEGATIVE
Blood, UA: NEGATIVE
Glucose, UA: NEGATIVE mg/dL
Ketones, POC UA: NEGATIVE mg/dL
Leukocytes, UA: NEGATIVE
Nitrite, UA: NEGATIVE
Protein Ur, POC: NEGATIVE mg/dL
Spec Grav, UA: 1.02 (ref 1.010–1.025)
Urobilinogen, UA: 0.2 U/dL
pH, UA: 7 (ref 5.0–8.0)

## 2022-11-21 LAB — HIV ANTIBODY (ROUTINE TESTING W REFLEX): HIV Screen 4th Generation wRfx: NONREACTIVE

## 2022-11-21 NOTE — ED Provider Notes (Signed)
[facility]    CSN: [id number] Arrival date & time: 11/21/22  1137      History   Chief Complaint Chief Complaint  Patient presents with   SEXUALLY TRANSMITTED DISEASE    HPI Raychel Dowler is a 22 y.o. female.   Patient presents to clinic requesting sexually-transmitted disease screening.  Since August she has had a new partner and would like screening.  She denies any known exposures to STIs, no changes to vaginal discharge, no vaginal odor, sores or dysuria.  She denies any dysuria but has had changes in urination, unable to describe these.  Would like HIV and syphilis screening.  The history is provided by the patient and medical records.    Past Medical History:  Diagnosis Date   Anemia    Herpes     There are no problems to display for this patient.   History reviewed. No pertinent surgical history.  OB History   No obstetric history on file.      Home Medications    Prior to Admission medications   Not on File    Family History Family History  Problem Relation Age of Onset   Hypertension Mother     Social History Social History   Tobacco Use   Smoking status: Never   Smokeless tobacco: Never  Vaping Use   Vaping status: Never Used  Substance Use Topics   Alcohol use: Never   Drug use: Never     Allergies   Patient has no known allergies.   Review of Systems Review of Systems  Per HPI   Physical Exam Triage Vital Signs ED Triage Vitals [11/21/22 1159]  Encounter Vitals Group     BP 110/74     Systolic BP Percentile      Diastolic BP Percentile      Pulse Rate 79     Resp 16     Temp 98.1 F (36.7 C)     Temp Source Oral     SpO2 98 %     Weight      Height      Head Circumference      Peak Flow      Pain Score 0     Pain Loc      Pain Education      Exclude from Growth Chart    No data found.  Updated Vital Signs BP 110/74 (BP Location: Right Arm)   Pulse 79   Temp 98.1 F (36.7 C) (Oral)    Resp 16   LMP 10/28/2022   SpO2 98%   Visual Acuity Right Eye Distance:   Left Eye Distance:   Bilateral Distance:    Right Eye Near:   Left Eye Near:    Bilateral Near:     Physical Exam Vitals and nursing note reviewed.  Constitutional:      Appearance: Normal appearance.  HENT:     Head: Normocephalic and atraumatic.     Right Ear: External ear normal.     Left Ear: External ear normal.     Nose: Nose normal.     Mouth/Throat:     Mouth: Mucous membranes are moist.  Eyes:     Conjunctiva/sclera: Conjunctivae normal.  Cardiovascular:     Rate and Rhythm: Normal rate.  Pulmonary:     Effort: Pulmonary effort is normal. No respiratory distress.  Musculoskeletal:        General: Normal range of motion.  Neurological:     General:  No focal deficit present.     Mental Status: She is alert.  Psychiatric:        Mood and Affect: Mood normal.      UC Treatments / Results  Labs (all labs ordered are listed, but only abnormal results are displayed) Labs Reviewed  POCT URINALYSIS DIP (MANUAL ENTRY) - Abnormal; Notable for the following components:      Result Value   Clarity, UA hazy (*)    All other components within normal limits  RPR  HIV ANTIBODY (ROUTINE TESTING W REFLEX)  CERVICOVAGINAL ANCILLARY ONLY    EKG   Radiology No results found.  Procedures Procedures (including critical care time)  Medications Ordered in UC Medications - No data to display  Initial Impression / Assessment and Plan / UC Course  I have reviewed the triage vital signs and the nursing notes.  Pertinent labs & imaging results that were available during my care of the patient were reviewed by me and considered in my medical decision making (see chart for details).  Vitals and triage reviewed, patient is hemodynamically stable.  Requesting STI screening, cytology swab, HIV and syphilis screening obtained.  Urinalysis does not show any infection. Staff will contact if abnormal  results to initiate protocol treatment. POC, f/u care and return precautions given, no questions at this time.      Final Clinical Impressions(s) / UC Diagnoses   Final diagnoses:  Screening examination for sexually transmitted disease     Discharge Instructions      Your urine did not show any obvious infection.  You have been screened for sexually transmitted infections today and the results will be available over the next few days on your MyChart.  Our staff will contact you if anything is abnormal to initiate the appropriate treatment.  Please abstain from intercourse until results have been received.  Please establish with a primary care provider for routine evaluations and screening exams.  Return to clinic for any new concerning symptoms.  You are 22, this is the recommended age to get a Pap smear.  Some primary care providers will do this, some may refer you out to a GYN.      ED Prescriptions   None    PDMP not reviewed this encounter.   Subrina Vecchiarelli, [place], [place] 11/21/22 1239

## 2022-11-21 NOTE — Discharge Instructions (Signed)
Your urine did not show any obvious infection.  You have been screened for sexually transmitted infections today and the results will be available over the next few days on your MyChart.  Our staff will contact you if anything is abnormal to initiate the appropriate treatment.  Please abstain from intercourse until results have been received.  Please establish with a primary care provider for routine evaluations and screening exams.  Return to clinic for any new concerning symptoms.  You are 22, this is the recommended age to get a Pap smear.  Some primary care providers will do this, some may refer you out to a GYN.

## 2022-11-21 NOTE — ED Triage Notes (Signed)
Pt requesting STD testing. Reports hasn't had it done in several months and has been sexually active. Reports some tingling intermittently over the past couple weeks. Denies known exposure, discharge, odors, etc

## 2022-11-22 LAB — RPR: RPR Ser Ql: NONREACTIVE

## 2022-11-24 LAB — CERVICOVAGINAL ANCILLARY ONLY
Chlamydia: NEGATIVE
Comment: NEGATIVE
Comment: NEGATIVE
Comment: NORMAL
Neisseria Gonorrhea: NEGATIVE
Trichomonas: NEGATIVE

## 2023-02-02 ENCOUNTER — Ambulatory Visit (HOSPITAL_COMMUNITY): Payer: Medicaid Other

## 2023-02-03 ENCOUNTER — Ambulatory Visit (HOSPITAL_COMMUNITY): Payer: Medicaid Other

## 2023-02-12 ENCOUNTER — Ambulatory Visit
Admission: EM | Admit: 2023-02-12 | Discharge: 2023-02-12 | Disposition: A | Discharge status: Home / Self Care | Payer: Medicaid Other

## 2023-02-12 ENCOUNTER — Encounter: Payer: Self-pay | Admitting: Emergency Medicine

## 2023-02-12 ENCOUNTER — Ambulatory Visit (INDEPENDENT_AMBULATORY_CARE_PROVIDER_SITE_OTHER): Payer: Medicaid Other

## 2023-02-12 DIAGNOSIS — R051 Acute cough: Secondary | ICD-10-CM

## 2023-02-12 DIAGNOSIS — J209 Acute bronchitis, unspecified: Secondary | ICD-10-CM

## 2023-02-12 MED ORDER — PREDNISONE 20 MG PO TABS: 40.0000 mg | ORAL_TABLET | Freq: Every day | ORAL | 0 refills | Status: AC

## 2023-02-12 NOTE — ED Provider Notes (Signed)
[name]    CSN: [id number] Arrival date & time: 02/12/23  1144      History   Chief Complaint Chief Complaint  Patient presents with   Cough   Nasal Congestion    HPI Tiffany Cannon is a 23 y.o. female.   Patient here today for evaluation of chest and nasal congestion with productive cough that started about 2 weeks ago.  She also reports losing her taste and smell.  She has not any fever chills or bodyaches.  She reports some intermittent chest discomfort with coughing episodes for the last week.  She has taken over-the-counter medication without resolution.  The history is provided by the patient.  Cough Associated symptoms: sore throat   Associated symptoms: no chills, no ear pain, no eye discharge, no fever, no shortness of breath and no wheezing     Past Medical History:  Diagnosis Date   Anemia    Herpes     Patient Active Problem List   Diagnosis Date Noted   Herpes simplex type 2 infection 02/11/2021   Hyperlipidemia 10/20/2020   Vitamin D deficiency 10/20/2020   Moderate depressive disorder 10/11/2020   Obesity (BMI 30-39.9) 10/11/2020   Acute tonsillitis 01/25/2014    History reviewed. No pertinent surgical history.  OB History   No obstetric history on file.      Home Medications    Prior to Admission medications   Medication Sig Start Date End Date Taking? Authorizing Provider  predniSONE (DELTASONE) 20 MG tablet Take 2 tablets (40 mg total) by mouth daily with breakfast for 5 days. 02/12/23 02/17/23 Yes Tomi Bamberger, PA-C  etonogestrel-ethinyl estradiol (NUVARING) 0.12-0.015 MG/24HR vaginal ring Place 1 each vaginally every 28 (twenty-eight) days. Patient not taking: Reported on 02/12/2023    [provider]  metroNIDAZOLE (FLAGYL) 500 MG tablet Take 500 mg by mouth 2 (two) times daily. Patient not taking: Reported on 02/12/2023 02/04/23   [provider]  Prenatal MV-Min-Fe Fum-FA-DHA (PRENATAL MULTI +DHA)  27-0.8-250 MG CAPS Take by mouth. Patient not taking: Reported on 02/12/2023    [provider]    Family History Family History  Problem Relation Age of Onset   Hypertension Mother     Social History Social History   Tobacco Use   Smoking status: Never   Smokeless tobacco: Never  Vaping Use   Vaping status: Never Used  Substance Use Topics   Alcohol use: Not Currently   Drug use: Yes    Frequency: 3.0 times per week    Types: Marijuana     Allergies   Patient has no known allergies.   Review of Systems Review of Systems  Constitutional:  Negative for chills and fever.  HENT:  Positive for congestion, sinus pressure and sore throat. Negative for ear pain.   Eyes:  Negative for discharge and redness.  Respiratory:  Positive for cough. Negative for shortness of breath and wheezing.   Gastrointestinal:  Negative for abdominal pain, diarrhea, nausea and vomiting.     Physical Exam Triage Vital Signs ED Triage Vitals [02/12/23 1201]  Encounter Vitals Group     BP 114/79     Systolic BP Percentile      Diastolic BP Percentile      Pulse Rate 93     Resp 18     Temp 98.3 F (36.8 C)     Temp Source Oral     SpO2 98 %     Weight  Height      Head Circumference      Peak Flow      Pain Score 0     Pain Loc      Pain Education      Exclude from Growth Chart    No data found.  Updated Vital Signs BP 114/79 (BP Location: Left Arm)   Pulse 93   Temp 98.3 F (36.8 C) (Oral)   Resp 18   LMP 01/16/2023 (Approximate)   SpO2 98%   Visual Acuity Right Eye Distance:   Left Eye Distance:   Bilateral Distance:    Right Eye Near:   Left Eye Near:    Bilateral Near:     Physical Exam Vitals and nursing note reviewed.  Constitutional:      General: She is not in acute distress.    Appearance: Normal appearance. She is not ill-appearing.  HENT:     Head: Normocephalic and atraumatic.     Right Ear: Tympanic membrane normal.     Left Ear:  Tympanic membrane normal.     Nose: Congestion present.     Mouth/Throat:     Mouth: Mucous membranes are moist.     Pharynx: No oropharyngeal exudate or posterior oropharyngeal erythema.  Eyes:     Conjunctiva/sclera: Conjunctivae normal.  Cardiovascular:     Rate and Rhythm: Normal rate and regular rhythm.     Heart sounds: Normal heart sounds. No murmur heard. Pulmonary:     Effort: Pulmonary effort is normal. No respiratory distress.     Breath sounds: Normal breath sounds. No wheezing, rhonchi or rales.  Skin:    General: Skin is warm and dry.  Neurological:     Mental Status: She is alert.  Psychiatric:        Mood and Affect: Mood normal.        Thought Content: Thought content normal.      UC Treatments / Results  Labs (all labs ordered are listed, but only abnormal results are displayed) Labs Reviewed - No data to display  EKG   Radiology DG Chest 2 View Result Date: 02/12/2023 CLINICAL DATA:  Productive cough with congestion and loss of taste/smell 2 weeks. EXAM: CHEST - 2 VIEW COMPARISON:  09/29/2018 FINDINGS: Lungs are adequately inflated and otherwise clear. Cardiomediastinal silhouette and remainder of the exam is normal. IMPRESSION: No active cardiopulmonary disease. Electronically Signed   By: Elberta Fortis M.D.   On: 02/12/2023 13:28    Procedures Procedures (including critical care time)  Medications Ordered in UC Medications - No data to display  Initial Impression / Assessment and Plan / UC Course  I have reviewed the triage vital signs and the nursing notes.  Pertinent labs & imaging results that were available during my care of the patient were reviewed by me and considered in my medical decision making (see chart for details).    X-ray without acute findings.  Suspect likely bronchitis and will treat with steroid burst and advised follow-up if no gradual improvement with any further concerns.  Final Clinical Impressions(s) / UC Diagnoses    Final diagnoses:  Acute cough  Acute bronchitis, unspecified organism   Discharge Instructions   None    ED Prescriptions     Medication Sig Dispense Auth. Provider   predniSONE (DELTASONE) 20 MG tablet Take 2 tablets (40 mg total) by mouth daily with breakfast for 5 days. 10 tablet Tomi Bamberger, PA-C      PDMP not reviewed this  encounter.   Tomi Bamberger, PA-C 02/15/23 1435

## 2023-02-12 NOTE — ED Triage Notes (Signed)
Pt reports chest/nasal congestion, productive cough w/ green sputum, and loss of taste/smell x2 weeks. Denies fevers, chills, and body aches. No OTC med use or at home testing. Pt reports intermittent chest discomfort with coughing episodes x1 week. States "her chest just feels heavy"

## 2023-02-15 ENCOUNTER — Encounter: Payer: Self-pay | Admitting: Physician Assistant

## 2023-04-04 ENCOUNTER — Encounter (HOSPITAL_COMMUNITY): Payer: Self-pay

## 2023-04-04 ENCOUNTER — Ambulatory Visit (HOSPITAL_COMMUNITY)
Admission: EM | Admit: 2023-04-04 | Discharge: 2023-04-04 | Disposition: A | Discharge status: Home / Self Care | Payer: Self-pay | Attending: Neurology | Admitting: Neurology

## 2023-04-04 DIAGNOSIS — Z113 Encounter for screening for infections with a predominantly sexual mode of transmission: Secondary | ICD-10-CM | POA: Insufficient documentation

## 2023-04-04 DIAGNOSIS — L0231 Cutaneous abscess of buttock: Secondary | ICD-10-CM | POA: Insufficient documentation

## 2023-04-04 LAB — POCT URINALYSIS DIP (MANUAL ENTRY)
Bilirubin, UA: NEGATIVE
Blood, UA: NEGATIVE
Glucose, UA: NEGATIVE mg/dL
Leukocytes, UA: NEGATIVE
Nitrite, UA: NEGATIVE
Spec Grav, UA: 1.025 (ref 1.010–1.025)
Urobilinogen, UA: 0.2 U/dL
pH, UA: 6 (ref 5.0–8.0)

## 2023-04-04 LAB — HIV ANTIBODY (ROUTINE TESTING W REFLEX): HIV Screen 4th Generation wRfx: NONREACTIVE

## 2023-04-04 MED ORDER — DOXYCYCLINE HYCLATE 100 MG PO CAPS: 100.0000 mg | ORAL_CAPSULE | Freq: Two times a day (BID) | ORAL | 0 refills | Status: DC

## 2023-04-04 MED ORDER — FLUCONAZOLE 150 MG PO TABS: 150.0000 mg | ORAL_TABLET | ORAL | 0 refills | Status: DC | PRN

## 2023-04-04 NOTE — ED Provider Notes (Addendum)
 [facility]    CSN: [id number] Arrival date & time: 04/04/23  1631      History   Chief Complaint Chief Complaint  Patient presents with   Abscess   SEXUALLY TRANSMITTED DISEASE    HPI Tiffany Cannon is a 23 y.o. female.   Patient reports that she was sexually active last week and since then noticed that she smells and odor from her vaginal area and has thick cottage cheeselike discharge.  Additionally she does have some boils that appear on her inner thighs, groin and buttocks.  Occasionally she will get them in her armpits as well.  At this time her boils are relatively small and nonpainful but they do appear to be in the process of getting bigger.  She has been using tea tree oil and warm compresses with some relief.   The history is provided by the patient.  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 No pertinent surgical history.  OB History   No obstetric history on file.      Home Medications    Prior to Admission medications   Medication Sig Start Date End Date Taking? Authorizing Provider  doxycycline (VIBRAMYCIN) 100 MG capsule Take 1 capsule (100 mg total) by mouth 2 (two) times daily. 04/04/23  Yes Elmer Picker, NP  fluconazole (DIFLUCAN) 150 MG tablet Take 1 tablet (150 mg total) by mouth every three (3) days as needed for up to 2 doses. 04/04/23  Yes Elmer Picker, NP  Prenatal MV-Min-Fe Fum-FA-DHA (PRENATAL MULTI +DHA) 27-0.8-250 MG CAPS Take by mouth. Patient not taking: Reported on 02/12/2023    [provider]    Family History Family History  Problem Relation Age of Onset   Hypertension Mother     Social  History Social History   Tobacco Use   Smoking status: Never   Smokeless tobacco: Never  Vaping Use   Vaping status: Never Used  Substance Use Topics   Alcohol use: Not Currently   Drug use: Not Currently    Frequency: 3.0 times per week    Types: Marijuana     Allergies   Patient has no known allergies.   Review of Systems Review of Systems   Physical Exam Triage Vital Signs ED Triage Vitals [04/04/23 1805]  Encounter Vitals Group     BP 117/78     Systolic BP Percentile      Diastolic BP Percentile      Pulse Rate 84     Resp 16     Temp 98.8 F (37.1 C)     Temp Source Oral     SpO2 98 %     Weight      Height      Head Circumference      Peak Flow      Pain Score 0     Pain Loc      Pain Education      Exclude from Growth Chart    No data found.  Updated Vital Signs BP 117/78 (BP Location: Left Arm)   Pulse 84   Temp 98.8 F (37.1 C) (Oral)   Resp  16   LMP 03/09/2023 (Approximate)   SpO2 98%   Visual Acuity Right Eye Distance:   Left Eye Distance:   Bilateral Distance:    Right Eye Near:   Left Eye Near:    Bilateral Near:     Physical Exam Vitals and nursing note reviewed.  Constitutional:      General: She is not in acute distress.    Appearance: She is well-developed.  HENT:     Head: Normocephalic and atraumatic.  Eyes:     Conjunctiva/sclera: Conjunctivae normal.  Cardiovascular:     Rate and Rhythm: Normal rate and regular rhythm.     Heart sounds: No murmur heard. Pulmonary:     Effort: Pulmonary effort is normal. No respiratory distress.     Breath sounds: Normal breath sounds.  Abdominal:     Palpations: Abdomen is soft.     Tenderness: There is no abdominal tenderness.  Musculoskeletal:        General: No swelling.     Cervical back: Neck supple.  Skin:    General: Skin is warm and dry.     Capillary Refill: Capillary refill takes less than 2 seconds.  Neurological:     Mental Status: She is alert.  Psychiatric:         Mood and Affect: Mood normal.      UC Treatments / Results  Labs (all labs ordered are listed, but only abnormal results are displayed) Labs Reviewed  POCT URINALYSIS DIP (MANUAL ENTRY) - Abnormal; Notable for the following components:      Result Value   Ketones, POC UA trace (5) (*)    Protein Ur, POC trace (*)    All other components within normal limits  HIV ANTIBODY (ROUTINE TESTING W REFLEX)  RPR  CERVICOVAGINAL ANCILLARY ONLY    EKG   Radiology No results found.  Procedures Procedures (including critical care time)  Medications Ordered in UC Medications - No data to display  Initial Impression / Assessment and Plan / UC Course  I have reviewed the triage vital signs and the nursing notes.  Pertinent labs & imaging results that were available during my care of the patient were reviewed by me and considered in my medical decision making (see chart for details).   Exam and history consistent with hidradenitis suppurativa. Will provide doxycycline 100 mg twice daily for 10 days for her boils.  STI and UTI testing is in process and we will follow-up with her with any abnormal results.  Patient in agreement with plan.  She will follow-up with her PCP for further management of her abscesses well.  POC urinalysis negative for UTI  Final Clinical Impressions(s) / UC Diagnoses   Final diagnoses:  Abscess of buttock, right  Routine screening for STI (sexually transmitted infection)     Discharge Instructions      Please take antibiotics as prescribed and will also provide diflucan for yeast infection prophylaxis.  We will contact you with any other positive testing and adjust antibiotics as needed. Please ensure good hygiene and keep areas with the boils clean and dry as able.      ED Prescriptions     Medication Sig Dispense Auth. Provider   doxycycline (VIBRAMYCIN) 100 MG capsule Take 1 capsule (100 mg total) by mouth 2 (two) times daily. 20 capsule  Elmer Picker, NP   fluconazole (DIFLUCAN) 150 MG tablet Take 1 tablet (150 mg total) by mouth every three (3) days as needed for up to  2 doses. 2 tablet Elmer Picker, NP      PDMP not reviewed this encounter.   Elmer Picker, NP 04/04/23 Lyndal Rainbow, NP 04/04/23 1837    Elmer Picker, NP 04/04/23 [phone]

## 2023-04-04 NOTE — Discharge Instructions (Addendum)
 Please take antibiotics as prescribed and will also provide diflucan for yeast infection prophylaxis.  We will contact you with any other positive testing and adjust antibiotics as needed. Please ensure good hygiene and keep areas with the boils clean and dry as able.

## 2023-04-04 NOTE — ED Triage Notes (Signed)
 Patient here today with c/o vaginal discharge and odor X 1 week.  Patient also c/o boils on her bilat inner thighs and right buttock X 1 week.

## 2023-04-05 ENCOUNTER — Ambulatory Visit (HOSPITAL_COMMUNITY): Payer: Self-pay

## 2023-04-05 LAB — RPR: RPR Ser Ql: NONREACTIVE

## 2023-04-07 ENCOUNTER — Telehealth (HOSPITAL_COMMUNITY): Payer: Self-pay

## 2023-04-07 LAB — CERVICOVAGINAL ANCILLARY ONLY
Bacterial Vaginitis (gardnerella): POSITIVE — AB
Candida Glabrata: NEGATIVE
Candida Vaginitis: NEGATIVE
Chlamydia: NEGATIVE
Comment: NEGATIVE
Comment: NEGATIVE
Comment: NEGATIVE
Comment: NEGATIVE
Comment: NEGATIVE
Comment: NORMAL
Neisseria Gonorrhea: NEGATIVE
Trichomonas: NEGATIVE

## 2023-04-07 MED ORDER — METRONIDAZOLE 500 MG PO TABS: 500.0000 mg | ORAL_TABLET | Freq: Two times a day (BID) | ORAL | 0 refills | Status: AC

## 2023-04-07 NOTE — Telephone Encounter (Signed)
 Per protocol, pt requires tx with metronidazole. Rx sent to pharmacy on file.

## 2023-12-16 ENCOUNTER — Ambulatory Visit (HOSPITAL_COMMUNITY): Payer: Self-pay

## 2024-01-02 ENCOUNTER — Other Ambulatory Visit: Payer: Self-pay

## 2024-01-02 ENCOUNTER — Encounter (HOSPITAL_COMMUNITY): Payer: Self-pay | Admitting: *Deleted

## 2024-01-02 ENCOUNTER — Ambulatory Visit (HOSPITAL_COMMUNITY)
Admission: EM | Admit: 2024-01-02 | Discharge: 2024-01-02 | Disposition: A | Discharge status: Home / Self Care | Payer: Self-pay | Attending: Emergency Medicine | Admitting: Emergency Medicine

## 2024-01-02 DIAGNOSIS — L732 Hidradenitis suppurativa: Secondary | ICD-10-CM

## 2024-01-02 DIAGNOSIS — Z3200 Encounter for pregnancy test, result unknown: Secondary | ICD-10-CM

## 2024-01-02 DIAGNOSIS — Z113 Encounter for screening for infections with a predominantly sexual mode of transmission: Secondary | ICD-10-CM

## 2024-01-02 DIAGNOSIS — N898 Other specified noninflammatory disorders of vagina: Secondary | ICD-10-CM

## 2024-01-02 LAB — POCT URINE PREGNANCY: Preg Test, Ur: NEGATIVE

## 2024-01-02 MED ORDER — DOXYCYCLINE HYCLATE 100 MG PO CAPS: 100.0000 mg | ORAL_CAPSULE | Freq: Two times a day (BID) | ORAL | 2 refills | Status: AC

## 2024-01-02 MED ORDER — CLINDAMYCIN PHOS (TWICE-DAILY) 1 % EX GEL: Freq: Two times a day (BID) | CUTANEOUS | 2 refills | Status: AC

## 2024-01-02 NOTE — ED Provider Notes (Signed)
 [facility]    CSN: [id number] Arrival date & time: 01/02/24  0810    HISTORY   Chief Complaint  Patient presents with   Vaginal Discharge   Mass   HPI Tiffany Cannon is a pleasant, 23 y.o. female who presents to urgent care today. Patient reports a 2-week history of vaginal odor and discharge.  States her most recent period was very light and would like to be tested for pregnancy.  Patient denies of burning with urination, suprapubic pain, known exposure to STD, fever, malaise, body aches, chills.  Patient also complains of worsening symptoms of known hidradenitis suppurativa.  States she has bumps on both sides of her groin area as well as under her chin.  The history is provided by the patient.  Vaginal Discharge  Past Medical History:  Diagnosis Date   Anemia    Herpes    Patient Active Problem List   Diagnosis Date Noted   Herpes simplex type 2 infection 02/11/2021   Hyperlipidemia 10/20/2020   Vitamin D deficiency 10/20/2020   Moderate depressive disorder 10/11/2020   Obesity (BMI 30-39.9) 10/11/2020   Acute tonsillitis 01/25/2014   History reviewed. No pertinent surgical history. OB History   No obstetric history on file.    Home Medications    Prior to Admission medications  Medication Sig Start Date End Date Taking? Authorizing Provider  clindamycin  (CLINDAGEL) 1 % gel Apply topically 2 (two) times daily. 01/02/24 04/01/24 Yes Joesph Shaver Scales, PA-C  doxycycline  (VIBRAMYCIN ) 100 MG capsule Take 1 capsule (100 mg total) by mouth 2 (two) times daily. 01/02/24 04/01/24 Yes Joesph Shaver Scales, PA-C    Family History Family History  Problem Relation Age of Onset   Hypertension Mother    Social History Social History[1] Allergies   Patient has no known allergies.  Review of Systems Review of Systems  Genitourinary:  Positive for vaginal discharge.   Pertinent findings revealed after performing a 14 point review of systems has been  noted in the history of present illness.  Physical Exam Vital Signs BP 116/84   Pulse 82   Temp 98.1 F (36.7 C) (Oral)   Resp 18   LMP 12/11/2023 (Approximate)   SpO2 98%   No data found.  Physical Exam Vitals and nursing note reviewed.  Constitutional:      General: She is awake. She is not in acute distress.    Appearance: Normal appearance. She is well-developed and well-groomed. She is not ill-appearing.  HENT:     Head: Normocephalic and atraumatic.  Neck:     Trachea: Trachea and phonation normal.  Cardiovascular:     Rate and Rhythm: Normal rate and regular rhythm.  Pulmonary:     Effort: Pulmonary effort is normal.  Musculoskeletal:        General: Normal range of motion.     Cervical back: Full passive range of motion without pain, normal range of motion and neck supple.  Lymphadenopathy:     Cervical: No cervical adenopathy.  Skin:    General: Skin is warm and dry.     Findings: Lesion (HS lesions appreciated beneath chin and in bilateral groin without surrounding erythema, drainage.) present. No erythema or rash.  Neurological:     General: No focal deficit present.     Mental Status: She is alert and oriented to person, place, and time. Mental status is at baseline.  Psychiatric:        Attention and Perception: Attention and perception normal.  Mood and Affect: Mood normal.        Speech: Speech normal.        Behavior: Behavior normal. Behavior is cooperative.        Thought Content: Thought content normal.     Visual Acuity Right Eye Distance:   Left Eye Distance:   Bilateral Distance:    Right Eye Near:   Left Eye Near:    Bilateral Near:     UC Couse / Diagnostics / Procedures:     Radiology No results found.  Procedures Procedures (including critical care time) EKG  Pending results:  Labs Reviewed  POCT URINE PREGNANCY  CERVICOVAGINAL ANCILLARY ONLY    Medications Ordered in UC: Medications - No data to display  UC  Diagnoses / Final Clinical Impressions(s)   I have reviewed the triage vital signs and the nursing notes.  Pertinent labs & imaging results that were available during my care of the patient were reviewed by me and considered in my medical decision making (see chart for details).    Final diagnoses:  Hidradenitis suppurativa of multiple sites  Vaginal discharge  Screening examination for STD (sexually transmitted disease)  Encounter for pregnancy test, result unknown   Urine pregnancy test today was negative, patient advised.  STD testing performed during visit today.  Will notify patient of results once received and treat as needed based on those results.  If patient does test positive for BV or yeast, recommend treatment per protocol.  For management of HS, patient advised to begin doxycycline  1 tablet twice daily and apply Clindagel to affected areas twice daily.  Patient provided with education regarding at bedtime in her AVS.  Patient advised to establish primary care with provider at a local fairly qualified health center given the patient has no insurance at this time.  Please see discharge instructions below for details of plan of care as provided to patient. ED Prescriptions     Medication Sig Dispense Auth. Provider   doxycycline  (VIBRAMYCIN ) 100 MG capsule Take 1 capsule (100 mg total) by mouth 2 (two) times daily. 60 capsule Joesph Shaver Scales, PA-C   clindamycin  (CLINDAGEL) 1 % gel Apply topically 2 (two) times daily. 120 g Joesph Shaver Scales, PA-C      PDMP not reviewed this encounter.    Discharge Instructions      I have enclosed information about hidradenitis suppurativa.  It is very important that you see your primary care provider so that you can get a referral to dermatology.  A dermatologist is the best person to manage this chronic skin condition.  The results of your vaginal swab STD testing today which screens for gonorrhea, chlamydia, trichomonas,  BV, and yeastwill be posted to your MyChart account once it is complete.  This typically takes 1 to 3 days.  The results of your vaginal swab test which screens for  will be posted to your MyChart account once it is complete.  This typically takes 1 to 3 days.  If any of your results are abnormal, you will receive a phone call regarding treatment.  Prescriptions, if any are needed, will be provided for you at your pharmacy.                  Your urine pregnancy test today is Negative.                      If you have not had complete resolution of your symptoms after completing  any recommended treatment or if your symptoms worsen, please return for repeat evaluation.                    Thank you for visiting [facility] today.  We appreciate the opportunity to participate in your care.     Disposition Upon Discharge:  Condition: stable for discharge home  Patient presented with an acute illness with associated systemic symptoms and significant discomfort requiring urgent management. In my opinion, this is a condition that a prudent lay person (someone who possesses an average knowledge of health and medicine) may potentially expect to result in complications if not addressed urgently such as respiratory distress, impairment of bodily function or dysfunction of bodily organs.   Routine symptom specific, illness specific and/or disease specific instructions were discussed with the patient and/or caregiver at length.   As such, the patient has been evaluated and assessed, work-up was performed and treatment was provided in alignment with urgent care protocols and evidence based medicine.  Patient/parent/caregiver has been advised that the patient may require follow up for further testing and treatment if the symptoms continue in spite of treatment, as clinically indicated and appropriate.  Patient/parent/caregiver has been advised to return to the [facility] or PCP if no better; to PCP or  the Emergency Department if new signs and symptoms develop, or if the current signs or symptoms continue to change or worsen for further workup, evaluation and treatment as clinically indicated and appropriate  The patient will follow up with their current PCP if and as advised. If the patient does not currently have a PCP we will assist them in obtaining one.   The patient may need specialty follow up if the symptoms continue, in spite of conservative treatment and management, for further workup, evaluation, consultation and treatment as clinically indicated and appropriate.  Patient/parent/caregiver verbalized understanding and agreement of plan as discussed.  All questions were addressed during visit.  Please see discharge instructions below for further details of plan.  This office note has been dictated using Teaching laboratory technician.  Unfortunately, this method of dictation can sometimes lead to typographical or grammatical errors.  I apologize for your inconvenience in advance if this occurs.  Please do not hesitate to reach out to me if clarification is needed.       [1]  Social History Tobacco Use   Smoking status: Never   Smokeless tobacco: Never  Vaping Use   Vaping status: Never Used  Substance Use Topics   Alcohol use: Not Currently   Drug use: Yes    Types: Marijuana     Joesph Shaver [place], [place] 01/02/24 [phone]

## 2024-01-02 NOTE — Discharge Instructions (Addendum)
 I have enclosed information about hidradenitis suppurativa.  It is very important that you see your primary care provider so that you can get a referral to dermatology.  A dermatologist is the best person to manage this chronic skin condition.  The results of your vaginal swab STD testing today which screens for gonorrhea, chlamydia, trichomonas, BV, and yeastwill be posted to your MyChart account once it is complete.  This typically takes 1 to 3 days.  The results of your vaginal swab test which screens for  will be posted to your MyChart account once it is complete.  This typically takes 1 to 3 days.  If any of your results are abnormal, you will receive a phone call regarding treatment.  Prescriptions, if any are needed, will be provided for you at your pharmacy.                  Your urine pregnancy test today is Negative.                      If you have not had complete resolution of your symptoms after completing any recommended treatment or if your symptoms worsen, please return for repeat evaluation.                    Thank you for visiting [facility] today.  We appreciate the opportunity to participate in your care.

## 2024-01-02 NOTE — ED Triage Notes (Addendum)
 C/O vaginal odor and discharge onset approx 2 wks ago.   Also c/o boils to bilat groin area and suprapubic area onset approx 2 wks ago.  Also c/o bumps to anterior neck area onset approx 1 yr ago.  Denies abd pain, denies fevers.  Pt also requesting pregnancy test. States her LMP was much lighter than normal.

## 2024-01-04 ENCOUNTER — Ambulatory Visit (HOSPITAL_COMMUNITY): Payer: Self-pay

## 2024-01-04 LAB — CERVICOVAGINAL ANCILLARY ONLY
Bacterial Vaginitis (gardnerella): POSITIVE — AB
Candida Glabrata: NEGATIVE
Candida Vaginitis: POSITIVE — AB
Chlamydia: POSITIVE — AB
Comment: NEGATIVE
Comment: NEGATIVE
Comment: NEGATIVE
Comment: NEGATIVE
Comment: NEGATIVE
Comment: NORMAL
Neisseria Gonorrhea: NEGATIVE
Trichomonas: NEGATIVE

## 2024-01-04 MED ORDER — FLUCONAZOLE 150 MG PO TABS: 150.0000 mg | ORAL_TABLET | Freq: Once | ORAL | 0 refills | Status: AC
